# Patient Record
Sex: Female | Born: 1990 | Hispanic: Yes | Marital: Married | Smoking: Never smoker
Health system: Southern US, Community
[De-identification: ages and names within clinical notes are randomized; demographics above are authoritative.]

## PROBLEM LIST (undated history)

## (undated) DIAGNOSIS — A048 Other specified bacterial intestinal infections: Secondary | ICD-10-CM

---

## 2016-09-30 ENCOUNTER — Encounter: Payer: Self-pay | Admitting: *Deleted

## 2016-09-30 ENCOUNTER — Emergency Department
Admission: EM | Admit: 2016-09-30 | Discharge: 2016-09-30 | Disposition: A | Discharge status: Home / Self Care | Payer: Self-pay | Attending: Emergency Medicine | Admitting: Emergency Medicine

## 2016-09-30 DIAGNOSIS — R42 Dizziness and giddiness: Secondary | ICD-10-CM | POA: Insufficient documentation

## 2016-09-30 DIAGNOSIS — R51 Headache: Secondary | ICD-10-CM | POA: Insufficient documentation

## 2016-09-30 HISTORY — DX: Other specified bacterial intestinal infections: A04.8

## 2016-09-30 LAB — CBC
HCT: 40.6 % (ref 35.0–47.0)
Hemoglobin: 13.5 g/dL (ref 12.0–16.0)
MCH: 29.3 pg (ref 26.0–34.0)
MCHC: 33.3 g/dL (ref 32.0–36.0)
MCV: 88 fL (ref 80.0–100.0)
PLATELETS: 249 10*3/uL (ref 150–440)
RBC: 4.61 MIL/uL (ref 3.80–5.20)
RDW: 13.7 % (ref 11.5–14.5)
WBC: 8.6 10*3/uL (ref 3.6–11.0)

## 2016-09-30 LAB — URINALYSIS, COMPLETE (UACMP) WITH MICROSCOPIC
BILIRUBIN URINE: NEGATIVE
Glucose, UA: NEGATIVE mg/dL
Hgb urine dipstick: NEGATIVE
KETONES UR: NEGATIVE mg/dL
LEUKOCYTES UA: NEGATIVE
Nitrite: NEGATIVE
PH: 5 (ref 5.0–8.0)
PROTEIN: NEGATIVE mg/dL
Specific Gravity, Urine: 1.014 (ref 1.005–1.030)

## 2016-09-30 LAB — BASIC METABOLIC PANEL
Anion gap: 8 (ref 5–15)
BUN: 11 mg/dL (ref 6–20)
CALCIUM: 9.5 mg/dL (ref 8.9–10.3)
CO2: 27 mmol/L (ref 22–32)
CREATININE: 0.63 mg/dL (ref 0.44–1.00)
Chloride: 104 mmol/L (ref 101–111)
GFR calc non Af Amer: >60 mL/min (ref >60)
Glucose, Bld: 97 mg/dL (ref 65–99)
Potassium: 3.7 mmol/L (ref 3.5–5.1)
SODIUM: 139 mmol/L (ref 135–145)

## 2016-09-30 LAB — PREGNANCY, URINE: PREG TEST UR: NEGATIVE

## 2016-09-30 LAB — POCT PREGNANCY, URINE: PREG TEST UR: NEGATIVE

## 2016-09-30 MED ORDER — MECLIZINE HCL 25 MG PO TABS
25.0000 mg | ORAL_TABLET | Freq: Once | ORAL | Status: AC
  Administered 2016-09-30: 25 mg via ORAL
  Filled 2016-09-30 (×2): qty 1

## 2016-09-30 MED ORDER — MECLIZINE HCL 25 MG PO TABS: 25.0000 mg | ORAL_TABLET | Freq: Three times a day (TID) | ORAL | 0 refills | Status: AC | PRN

## 2016-09-30 NOTE — Discharge Instructions (Signed)
Please seek medical attention for any high fevers, chest pain, shortness of breath, change in behavior, persistent vomiting, bloody stool or any other new or concerning symptoms.  

## 2016-09-30 NOTE — ED Triage Notes (Signed)
Pt presents w/ c/o dizziness starting this morning when she woke. Pt states dizziness worse w/ position change. Pt has flown here from [place] in past 30 days to visit. Pt denies n/v/d. Pt states able to drink PO fluids. Pt denies relief from dizziness. Pt not using hormone therapy for birth control at this time. Pt has no chronic or acute illness at this time per report.

## 2016-09-30 NOTE — ED Notes (Signed)
Pt friend is interpreter ing for her and pt states that is ok - pt states that she has been dizzy since she woke up this morning - she flew in from [place] 20 days ago - denies nausea or vomiting - pt states that the dizziness is causing her to have a headache off and on - pt reports that when she is laying down she is not dizzy but with position changes she becomes dizzy

## 2016-09-30 NOTE — ED Provider Notes (Signed)
[facility] Emergency Department Provider Note  ____________________________________________   I have reviewed the triage vital signs and the nursing notes.   HISTORY  Chief Complaint Dizziness   History limited by: Language [facility] Interpreter utilized   HPI Destiny Franklin is a 26 y.o. female who presents to the emergency department today with primary concern for dizziness. The patient states it started this morning when she woke up. She noticed it when she went to stand up and get out of bed. She does describe it as the sensation of the room spinning around and she has a hard time fixating on a point. This goes away when the patient lays back down. It also happens of the patient rolls in bed. Patient has had a mild headache with this. She does have history of somewhat similar symptoms roughly 2 weeks ago. The patient went to urgent care where during exam the doctor noticed RLQ pain. The patient denies having pain there otherwise and only had pain when he was pressing hard.   Past Medical History:  Diagnosis Date  . H. pylori infection     There are no active problems to display for this patient.   History reviewed. No pertinent surgical history.  Prior to Admission medications   Not on File    Allergies Metoclopramide  History reviewed. No pertinent family history.  Social History Social History  Substance Use Topics  . Smoking status: Never Smoker  . Smokeless tobacco: Never Used  . Alcohol use No    Review of Systems  Constitutional: Negative for fever. Cardiovascular: Negative for chest pain. Respiratory: Negative for shortness of breath. Gastrointestinal: Negative for abdominal pain, vomiting and diarrhea. Genitourinary: Negative for dysuria. Musculoskeletal: Negative for back pain. Skin: Negative for rash. Neurological: Positive for dizziness.   10-point ROS otherwise  negative.  ____________________________________________   PHYSICAL EXAM:  VITAL SIGNS: ED Triage Vitals  Enc Vitals Group     BP 09/30/16 1950 127/83     Pulse Rate 09/30/16 1950 70     Resp 09/30/16 1950 20     Temp 09/30/16 1950 99.4 F (37.4 C)     Temp Source 09/30/16 1950 Oral     SpO2 09/30/16 1950 100 %     Weight 09/30/16 1950 150 lb (68 kg)     Height 09/30/16 1950 5\' 7"  (1.702 m)     Head Circumference --      Peak Flow --      Pain Score 09/30/16 1955 5   Constitutional: Alert and oriented. Well appearing and in no distress. Eyes: Conjunctivae are normal. Normal extraocular movements. ENT   Head: Normocephalic and atraumatic.   Nose: No congestion/rhinnorhea.   Mouth/Throat: Mucous membranes are moist.   Neck: No stridor. Hematological/Lymphatic/Immunilogical: No cervical lymphadenopathy. Cardiovascular: Normal rate, regular rhythm.  No murmurs, rubs, or gallops. Respiratory: Normal respiratory effort without tachypnea nor retractions. Breath sounds are clear and equal bilaterally. No wheezes/rales/rhonchi. Gastrointestinal: Soft and non tender. No rebound. No guarding.  Genitourinary: Deferred Musculoskeletal: Normal range of motion in all extremities. No lower extremity edema. Neurologic:  Normal speech and language. No gross focal neurologic deficits are appreciated.  Skin:  Skin is warm, dry and intact. No rash noted. Psychiatric: Mood and affect are normal. Speech and behavior are normal. Patient exhibits appropriate insight and judgment.  ____________________________________________    LABS (pertinent positives/negatives)  Labs Reviewed  URINALYSIS, COMPLETE (UACMP) WITH MICROSCOPIC - Abnormal; Notable for the following:  Result Value   Color, Urine YELLOW (*)    APPearance HAZY (*)    Bacteria, UA RARE (*)    Squamous Epithelial / LPF 6-30 (*)    All other components within normal limits  BASIC METABOLIC PANEL  CBC  PREGNANCY,  URINE  POCT PREGNANCY, URINE  CBG MONITORING, ED     ____________________________________________   EKG  I, Phineas SemenGraydon Wael Maestas, attending physician, personally viewed and interpreted this EKG  EKG Time: 2023 Rate: 89 Rhythm: normal sinus rhythm Axis: normal Intervals: qtc 435 QRS: narrow ST changes: no st elevation Impression: normal ekg   ____________________________________________    RADIOLOGY  None   ____________________________________________   PROCEDURES  Procedures  ____________________________________________   INITIAL IMPRESSION / ASSESSMENT AND PLAN / ED COURSE  Pertinent labs & imaging results that were available during my care of the patient were reviewed by me and considered in my medical decision making (see chart for details).  Patient presented to the emergency department today with concerns for vertigo type symptoms. Patient did feel better after Antivert. Blood work without any concerning anemia or electrolyte abnormalities. At this point will plan on discharging home with prescription for Antivert.  ____________________________________________   FINAL CLINICAL IMPRESSION(S) / ED DIAGNOSES  Final diagnoses:  Vertigo     Note: This dictation was prepared with Dragon dictation. Any transcriptional errors that result from this process are unintentional     Phineas SemenGraydon Derk Doubek, MD 09/30/16 2159

## 2016-09-30 NOTE — ED Notes (Signed)
ED Provider at bedside. 

## 2017-09-16 ENCOUNTER — Other Ambulatory Visit: Payer: Self-pay

## 2017-09-16 ENCOUNTER — Encounter: Payer: Self-pay | Admitting: Emergency Medicine

## 2017-09-16 DIAGNOSIS — N83202 Unspecified ovarian cyst, left side: Secondary | ICD-10-CM | POA: Insufficient documentation

## 2017-09-16 DIAGNOSIS — N390 Urinary tract infection, site not specified: Secondary | ICD-10-CM | POA: Insufficient documentation

## 2017-09-16 LAB — COMPREHENSIVE METABOLIC PANEL
ALT: 19 U/L (ref 14–54)
ANION GAP: 10 (ref 5–15)
AST: 25 U/L (ref 15–41)
Albumin: 4.6 g/dL (ref 3.5–5.0)
Alkaline Phosphatase: 45 U/L (ref 38–126)
BUN: 13 mg/dL (ref 6–20)
CALCIUM: 9.3 mg/dL (ref 8.9–10.3)
CHLORIDE: 105 mmol/L (ref 101–111)
CO2: 23 mmol/L (ref 22–32)
CREATININE: 0.69 mg/dL (ref 0.44–1.00)
GFR calc Af Amer: >60 mL/min (ref >60)
Glucose, Bld: 101 mg/dL — ABNORMAL HIGH (ref 65–99)
Potassium: 3.4 mmol/L — ABNORMAL LOW (ref 3.5–5.1)
SODIUM: 138 mmol/L (ref 135–145)
Total Bilirubin: 0.5 mg/dL (ref 0.3–1.2)
Total Protein: 7.6 g/dL (ref 6.5–8.1)

## 2017-09-16 LAB — CBC
HCT: 38.3 % (ref 35.0–47.0)
HEMOGLOBIN: 12.6 g/dL (ref 12.0–16.0)
MCH: 28.6 pg (ref 26.0–34.0)
MCHC: 33 g/dL (ref 32.0–36.0)
MCV: 86.7 fL (ref 80.0–100.0)
PLATELETS: 258 10*3/uL (ref 150–440)
RBC: 4.42 MIL/uL (ref 3.80–5.20)
RDW: 13.6 % (ref 11.5–14.5)
WBC: 9 10*3/uL (ref 3.6–11.0)

## 2017-09-16 LAB — URINALYSIS, COMPLETE (UACMP) WITH MICROSCOPIC
Bilirubin Urine: NEGATIVE
GLUCOSE, UA: NEGATIVE mg/dL
KETONES UR: NEGATIVE mg/dL
Nitrite: NEGATIVE
PROTEIN: NEGATIVE mg/dL
Specific Gravity, Urine: 1.016 (ref 1.005–1.030)
pH: 5 (ref 5.0–8.0)

## 2017-09-16 LAB — POCT PREGNANCY, URINE: PREG TEST UR: NEGATIVE

## 2017-09-16 LAB — LIPASE, BLOOD: LIPASE: 39 U/L (ref 11–51)

## 2017-09-16 NOTE — ED Triage Notes (Signed)
Patient to ER for c/o generalized abd pain, shortness of breath, fever, N/V/D since 09/03/17. Patient states her period was on 08/29/17, then developed these symptoms 5 days later. States when she lays down she is not nauseated, but feels nauseated upon waking. Patient in no acute distress at this time. Ambulatory to triage without difficulty.

## 2017-09-17 ENCOUNTER — Emergency Department: Payer: Self-pay

## 2017-09-17 ENCOUNTER — Emergency Department
Admission: EM | Admit: 2017-09-17 | Discharge: 2017-09-17 | Disposition: A | Discharge status: Home / Self Care | Payer: Self-pay | Attending: Emergency Medicine | Admitting: Emergency Medicine

## 2017-09-17 DIAGNOSIS — R52 Pain, unspecified: Secondary | ICD-10-CM

## 2017-09-17 DIAGNOSIS — N390 Urinary tract infection, site not specified: Secondary | ICD-10-CM

## 2017-09-17 DIAGNOSIS — N83202 Unspecified ovarian cyst, left side: Secondary | ICD-10-CM

## 2017-09-17 DIAGNOSIS — R102 Pelvic and perineal pain: Secondary | ICD-10-CM

## 2017-09-17 LAB — WET PREP, GENITAL
Clue Cells Wet Prep HPF POC: NONE SEEN
SPERM: NONE SEEN
Trich, Wet Prep: NONE SEEN
YEAST WET PREP: NONE SEEN

## 2017-09-17 LAB — HCG, QUANTITATIVE, PREGNANCY

## 2017-09-17 LAB — CHLAMYDIA/NGC RT PCR (ARMC ONLY)
CHLAMYDIA TR: NOT DETECTED
N gonorrhoeae: NOT DETECTED

## 2017-09-17 MED ORDER — FOSFOMYCIN TROMETHAMINE 3 G PO PACK
3.0000 g | PACK | Freq: Once | ORAL | Status: AC
  Administered 2017-09-17: 3 g via ORAL
  Filled 2017-09-17: qty 3

## 2017-09-17 NOTE — Discharge Instructions (Signed)
You may take Tylenol and/or Ibuprofen as needed for discomfort.  Return to the ER for worsening symptoms, persistent vomiting, fever, difficulty breathing or other concerns.

## 2017-09-17 NOTE — ED Provider Notes (Signed)
[facility] Emergency Department Provider Note   ____________________________________________   First MD Initiated Contact with Patient 09/17/17 0256     (approximate)  I have reviewed the triage vital signs and the nursing notes.   HISTORY  Chief Complaint Abdominal Pain    HPI Destiny Franklin is a 27 y.o. female who presents to the ED from home with a chief complaint of pelvic pain.  Patient reports having a shorter than usual.  From 2/23-2/25.  Complains of pelvic pain and nausea only when she lays flat.  Triage note indicates patient complained of shortness of breath, fever, N/V/D; patient denies these complaints to me.  Thinks she might be pregnant because her breasts are tender.  Last sexual intercourse 5 days ago.  Denies current vaginal bleeding or discharge.  Denies fever, chills, chest pain, shortness of breath, dysuria, diarrhea.  Denies recent travel or trauma.   Past Medical History:  Diagnosis Date  . H. pylori infection   History of ovarian cysts  There are no active problems to display for this patient.   History reviewed. No pertinent surgical history.  Prior to Admission medications   Medication Sig Start Date End Date Taking? Authorizing Provider  meclizine (ANTIVERT) 25 MG tablet Take 1 tablet (25 mg total) by mouth 3 (three) times daily as needed for dizziness. 09/30/16   Phineas Semen, MD    Allergies Metoclopramide  No family history on file.  Social History Social History   Tobacco Use  . Smoking status: Never Smoker  . Smokeless tobacco: Never Used  Substance Use Topics  . Alcohol use: No  . Drug use: No    Review of Systems  Constitutional: No fever/chills. Eyes: No visual changes. ENT: No sore throat. Cardiovascular: Denies chest pain. Respiratory: Denies shortness of breath. Gastrointestinal: Positive for pelvic pain.  No abdominal pain.  Positive for nausea, no vomiting.  No diarrhea.  No  constipation. Genitourinary: Negative for dysuria. Musculoskeletal: Negative for back pain. Skin: Negative for rash. Neurological: Negative for headaches, focal weakness or numbness.   ____________________________________________   PHYSICAL EXAM:  VITAL SIGNS: ED Triage Vitals [09/16/17 2311]  Enc Vitals Group     BP (!) 147/79     Pulse Rate 83     Resp 20     Temp 98.8 F (37.1 C)     Temp Source Oral     SpO2 100 %     Weight 160 lb (72.6 kg)     Height 5\' 5"  (1.651 m)     Head Circumference      Peak Flow      Pain Score 8     Pain Loc      Pain Edu?      Excl. in GC?     Constitutional: Alert and oriented. Well appearing and in no acute distress. Eyes: Conjunctivae are normal. PERRL. EOMI. Head: Atraumatic. Nose: No congestion/rhinnorhea. Mouth/Throat: Mucous membranes are moist.  Oropharynx non-erythematous. Neck: No stridor.   Cardiovascular: Normal rate, regular rhythm. Grossly normal heart sounds.  Good peripheral circulation. Respiratory: Normal respiratory effort.  No retractions. Lungs CTAB. Gastrointestinal: Soft and nontender to light or deep palpation. No distention. No abdominal bruits. No CVA tenderness. Musculoskeletal: No lower extremity tenderness nor edema.  No joint effusions. Neurologic:  Normal speech and language. No gross focal neurologic deficits are appreciated. No gait instability. Skin:  Skin is warm, dry and intact. No rash noted. Psychiatric: Mood and affect are normal. Speech and  behavior are normal.  ____________________________________________   LABS (all labs ordered are listed, but only abnormal results are displayed)  Labs Reviewed  WET PREP, GENITAL - Abnormal; Notable for the following components:      Result Value   WBC, Wet Prep HPF POC MANY (*)    All other components within normal limits  COMPREHENSIVE METABOLIC PANEL - Abnormal; Notable for the following components:   Potassium 3.4 (*)    Glucose, Bld 101 (*)     All other components within normal limits  URINALYSIS, COMPLETE (UACMP) WITH MICROSCOPIC - Abnormal; Notable for the following components:   Color, Urine YELLOW (*)    APPearance CLEAR (*)    Hgb urine dipstick SMALL (*)    Leukocytes, UA TRACE (*)    Bacteria, UA RARE (*)    Squamous Epithelial / LPF 0-5 (*)    All other components within normal limits  CHLAMYDIA/NGC RT PCR ([facility] ONLY)  LIPASE, BLOOD  CBC  HCG, QUANTITATIVE, PREGNANCY  POC URINE PREG, ED  POCT PREGNANCY, URINE   ____________________________________________  EKG  None ____________________________________________  RADIOLOGY  ED MD interpretation: Left corpus luteum cyst  Official radiology report(s): US Pelvis Transvanginal Non-ob (tv Only)  Result Date: 09/17/2017 CLINICAL DATA:  Nonspecific pelvic pain for 2 weeks EXAM: TRANSABDOMINAL AND TRANSVAGINAL ULTRASOUND OF PELVIS DOPPLER ULTRASOUND OF OVARIES TECHNIQUE: Both transabdominal and transvaginal ultrasound examinations of the pelvis were performed. Transabdominal technique was performed for global imaging of the pelvis including uterus, ovaries, adnexal regions, and pelvic cul-de-sac. It was necessary to proceed with endovaginal exam following the transabdominal exam to visualize the endometrium and ovaries. Color and duplex Doppler ultrasound was utilized to evaluate blood flow to the ovaries. COMPARISON:  None. FINDINGS: Uterus Measurements: 6.8 x 3.4 x 4.8 cm. No fibroids or other mass visualized. Endometrium Thickness: 8 mm.  No focal abnormality visualized. Right ovary Measurements: 3.9 x 2.1 x 2.0 cm. Normal appearance/no adnexal mass. Left ovary Measurements: 4.0 x 2.7 x 2.9 cm. Suspected corpus luteum cyst measuring 1.6 x 2.1 x 1.6 cm. Pulsed Doppler evaluation of both ovaries demonstrates normal low-resistance arterial and venous waveforms. Other findings Small amount of free fluid. IMPRESSION: 1. No acute abnormality of the pelvis. 2. Probable  left corpus luteum cyst. Electronically Signed   By: Deatra Robinson M.D.   On: 09/17/2017 04:30   US Pelvis Complete  Result Date: 09/17/2017 CLINICAL DATA:  Nonspecific pelvic pain for 2 weeks EXAM: TRANSABDOMINAL AND TRANSVAGINAL ULTRASOUND OF PELVIS DOPPLER ULTRASOUND OF OVARIES TECHNIQUE: Both transabdominal and transvaginal ultrasound examinations of the pelvis were performed. Transabdominal technique was performed for global imaging of the pelvis including uterus, ovaries, adnexal regions, and pelvic cul-de-sac. It was necessary to proceed with endovaginal exam following the transabdominal exam to visualize the endometrium and ovaries. Color and duplex Doppler ultrasound was utilized to evaluate blood flow to the ovaries. COMPARISON:  None. FINDINGS: Uterus Measurements: 6.8 x 3.4 x 4.8 cm. No fibroids or other mass visualized. Endometrium Thickness: 8 mm.  No focal abnormality visualized. Right ovary Measurements: 3.9 x 2.1 x 2.0 cm. Normal appearance/no adnexal mass. Left ovary Measurements: 4.0 x 2.7 x 2.9 cm. Suspected corpus luteum cyst measuring 1.6 x 2.1 x 1.6 cm. Pulsed Doppler evaluation of both ovaries demonstrates normal low-resistance arterial and venous waveforms. Other findings Small amount of free fluid. IMPRESSION: 1. No acute abnormality of the pelvis. 2. Probable left corpus luteum cyst. Electronically Signed   By: Deatra Robinson M.D.   On:  09/17/2017 04:30   [place] Pelvic Doppler (torsion R/o Or Mass Arterial Flow)  Result Date: 09/17/2017 CLINICAL DATA:  Nonspecific pelvic pain for 2 weeks EXAM: TRANSABDOMINAL AND TRANSVAGINAL ULTRASOUND OF PELVIS DOPPLER ULTRASOUND OF OVARIES TECHNIQUE: Both transabdominal and transvaginal ultrasound examinations of the pelvis were performed. Transabdominal technique was performed for global imaging of the pelvis including uterus, ovaries, adnexal regions, and pelvic cul-de-sac. It was necessary to proceed with endovaginal exam following the  transabdominal exam to visualize the endometrium and ovaries. Color and duplex Doppler ultrasound was utilized to evaluate blood flow to the ovaries. COMPARISON:  None. FINDINGS: Uterus Measurements: 6.8 x 3.4 x 4.8 cm. No fibroids or other mass visualized. Endometrium Thickness: 8 mm.  No focal abnormality visualized. Right ovary Measurements: 3.9 x 2.1 x 2.0 cm. Normal appearance/no adnexal mass. Left ovary Measurements: 4.0 x 2.7 x 2.9 cm. Suspected corpus luteum cyst measuring 1.6 x 2.1 x 1.6 cm. Pulsed Doppler evaluation of both ovaries demonstrates normal low-resistance arterial and venous waveforms. Other findings Small amount of free fluid. IMPRESSION: 1. No acute abnormality of the pelvis. 2. Probable left corpus luteum cyst. Electronically Signed   By: Deatra RobinsonKevin  Herman M.D.   On: 09/17/2017 04:30    ____________________________________________   PROCEDURES  Procedure(s) performed:   Pelvic exam: External exam WNL without rashes, lesions or vesicles. Speculum exam reveals no active bleeding. White discharge noted. Bimanual exam WNL.  Procedures  Critical Care performed: No  ____________________________________________   INITIAL IMPRESSION / ASSESSMENT AND PLAN / ED COURSE  As part of my medical decision making, I reviewed the following data within the electronic MEDICAL RECORD NUMBER Nursing notes reviewed and incorporated, Labs reviewed, Old chart reviewed, Radiograph reviewed  and Notes from prior ED visits   27 year old female who presents with shorter than usual last menstrual cycle, pelvic pain, tender breasts and nausea only when she lays down. Differential diagnosis includes, but is not limited to, ovarian cyst, ovarian torsion, acute appendicitis, diverticulitis, urinary tract infection/pyelonephritis, endometriosis, bowel obstruction, colitis, renal colic, gastroenteritis, hernia, fibroids, endometriosis, pregnancy related pain including ectopic pregnancy, etc.  Laboratory and  urinalysis results remarkable for mild UTI.  Will obtain beta-hCG, perform pelvic exam with swabs and proceed with pelvic ultrasound.  Clinical Course as of Sep 18 711  Thu Sep 17, 2017  0604 Updated patient of ultrasound results.  She desires to wait for chlamydia/gonorrhea results to return.  [JS]  [id number] Updated patient of negative STD result.  Will administer single dose of fosfomycin prior to discharge for mild UTI.  Strict return precautions given.  Patient verbalizes understanding and agrees with plan of care.  [JS]    Clinical Course User Index [JS] Irean HongSung, Montrez Marietta J, MD     ____________________________________________   FINAL CLINICAL IMPRESSION(S) / ED DIAGNOSES  Final diagnoses:  Pain  Pelvic pain in female  Cyst of left ovary  Lower urinary tract infectious disease     ED Discharge Orders    None       Note:  This document was prepared using Dragon voice recognition software and may include unintentional dictation errors.    Irean HongSung, Maresha Anastos J, MD 09/17/17 [phone] [id number]

## 2017-11-21 ENCOUNTER — Other Ambulatory Visit: Payer: Self-pay

## 2017-11-21 ENCOUNTER — Emergency Department (HOSPITAL_COMMUNITY)
Admission: EM | Admit: 2017-11-21 | Discharge: 2017-11-21 | Disposition: A | Discharge status: Home / Self Care | Payer: Self-pay | Attending: Emergency Medicine | Admitting: Emergency Medicine

## 2017-11-21 ENCOUNTER — Encounter (HOSPITAL_COMMUNITY): Payer: Self-pay

## 2017-11-21 DIAGNOSIS — Z5321 Procedure and treatment not carried out due to patient leaving prior to being seen by health care provider: Secondary | ICD-10-CM | POA: Insufficient documentation

## 2017-11-21 DIAGNOSIS — R1110 Vomiting, unspecified: Secondary | ICD-10-CM | POA: Insufficient documentation

## 2017-11-21 DIAGNOSIS — Z3201 Encounter for pregnancy test, result positive: Secondary | ICD-10-CM | POA: Insufficient documentation

## 2017-11-21 LAB — I-STAT BETA HCG BLOOD, ED (MC, WL, AP ONLY): I-stat hCG, quantitative: 450.8 m[IU]/mL — ABNORMAL HIGH (ref <5)

## 2017-11-21 NOTE — ED Triage Notes (Signed)
She has had a few episodes of emesis. She is concerned she may be pregnant. She denies fever/dysuria nor any other sign of illness.

## 2017-11-21 NOTE — ED Notes (Signed)
Pt requesting to leave but wanted to know pregnancy test results.  Spoke to MD Dr. Jeraldine Loots.  MD gave permission to tell patient she is pregnant.  Pt then admits to having a positive pregnancy test but wanted ER to confirm.  Gave information regarding community health and wellness and told to follow up with [facility] physician.  Pt left without being seen.

## 2017-12-21 ENCOUNTER — Other Ambulatory Visit: Payer: Self-pay | Admitting: Advanced Practice Midwife

## 2017-12-21 DIAGNOSIS — Z369 Encounter for antenatal screening, unspecified: Secondary | ICD-10-CM

## 2018-01-04 ENCOUNTER — Encounter: Payer: Self-pay | Admitting: Emergency Medicine

## 2018-01-04 ENCOUNTER — Other Ambulatory Visit: Payer: Self-pay

## 2018-01-04 ENCOUNTER — Emergency Department: Payer: Self-pay

## 2018-01-04 ENCOUNTER — Emergency Department
Admission: EM | Admit: 2018-01-04 | Discharge: 2018-01-04 | Disposition: A | Discharge status: Home / Self Care | Payer: Self-pay | Attending: Emergency Medicine | Admitting: Emergency Medicine

## 2018-01-04 DIAGNOSIS — O9989 Other specified diseases and conditions complicating pregnancy, childbirth and the puerperium: Secondary | ICD-10-CM | POA: Insufficient documentation

## 2018-01-04 DIAGNOSIS — O23591 Infection of other part of genital tract in pregnancy, first trimester: Secondary | ICD-10-CM | POA: Insufficient documentation

## 2018-01-04 DIAGNOSIS — Z3A10 10 weeks gestation of pregnancy: Secondary | ICD-10-CM | POA: Insufficient documentation

## 2018-01-04 DIAGNOSIS — R1031 Right lower quadrant pain: Secondary | ICD-10-CM | POA: Insufficient documentation

## 2018-01-04 DIAGNOSIS — B9689 Other specified bacterial agents as the cause of diseases classified elsewhere: Secondary | ICD-10-CM

## 2018-01-04 DIAGNOSIS — N760 Acute vaginitis: Secondary | ICD-10-CM

## 2018-01-04 DIAGNOSIS — R109 Unspecified abdominal pain: Secondary | ICD-10-CM

## 2018-01-04 DIAGNOSIS — O26891 Other specified pregnancy related conditions, first trimester: Secondary | ICD-10-CM

## 2018-01-04 LAB — LIPASE, BLOOD: Lipase: 40 U/L (ref 11–51)

## 2018-01-04 LAB — WET PREP, GENITAL
Clue Cells Wet Prep HPF POC: NONE SEEN
SPERM: NONE SEEN
Trich, Wet Prep: NONE SEEN
YEAST WET PREP: NONE SEEN

## 2018-01-04 LAB — COMPREHENSIVE METABOLIC PANEL
ALK PHOS: 34 U/L — AB (ref 38–126)
ALT: 12 U/L (ref 0–44)
ANION GAP: 7 (ref 5–15)
AST: 17 U/L (ref 15–41)
Albumin: 3.9 g/dL (ref 3.5–5.0)
BILIRUBIN TOTAL: 0.8 mg/dL (ref 0.3–1.2)
BUN: 9 mg/dL (ref 6–20)
CALCIUM: 9.1 mg/dL (ref 8.9–10.3)
CO2: 23 mmol/L (ref 22–32)
Chloride: 107 mmol/L (ref 98–111)
Creatinine, Ser: 0.42 mg/dL — ABNORMAL LOW (ref 0.44–1.00)
GLUCOSE: 95 mg/dL (ref 70–99)
Potassium: 3.7 mmol/L (ref 3.5–5.1)
Sodium: 137 mmol/L (ref 135–145)
TOTAL PROTEIN: 7 g/dL (ref 6.5–8.1)

## 2018-01-04 LAB — URINALYSIS, COMPLETE (UACMP) WITH MICROSCOPIC
BILIRUBIN URINE: NEGATIVE
GLUCOSE, UA: NEGATIVE mg/dL
Hgb urine dipstick: NEGATIVE
KETONES UR: NEGATIVE mg/dL
LEUKOCYTES UA: NEGATIVE
NITRITE: NEGATIVE
PH: 5 (ref 5.0–8.0)
PROTEIN: NEGATIVE mg/dL
Specific Gravity, Urine: 1.003 — ABNORMAL LOW (ref 1.005–1.030)
WBC, UA: NONE SEEN WBC/hpf (ref 0–5)

## 2018-01-04 LAB — CBC
HEMATOCRIT: 32.2 % — AB (ref 35.0–47.0)
HEMOGLOBIN: 11.5 g/dL — AB (ref 12.0–16.0)
MCH: 30.6 pg (ref 26.0–34.0)
MCHC: 35.7 g/dL (ref 32.0–36.0)
MCV: 85.6 fL (ref 80.0–100.0)
Platelets: 235 10*3/uL (ref 150–440)
RBC: 3.76 MIL/uL — ABNORMAL LOW (ref 3.80–5.20)
RDW: 13.1 % (ref 11.5–14.5)
WBC: 8.6 10*3/uL (ref 3.6–11.0)

## 2018-01-04 LAB — CHLAMYDIA/NGC RT PCR (ARMC ONLY)
Chlamydia Tr: NOT DETECTED
N GONORRHOEAE: NOT DETECTED

## 2018-01-04 LAB — HCG, QUANTITATIVE, PREGNANCY: hCG, Beta Chain, Quant, S: 87512 m[IU]/mL — ABNORMAL HIGH (ref <5)

## 2018-01-04 MED ORDER — METRONIDAZOLE 500 MG PO TABS
500.0000 mg | ORAL_TABLET | Freq: Once | ORAL | Status: AC
  Administered 2018-01-04: 500 mg via ORAL
  Filled 2018-01-04: qty 1

## 2018-01-04 MED ORDER — ACETAMINOPHEN 500 MG PO TABS
1000.0000 mg | ORAL_TABLET | Freq: Once | ORAL | Status: AC
  Administered 2018-01-04: 1000 mg via ORAL
  Filled 2018-01-04: qty 2

## 2018-01-04 MED ORDER — METRONIDAZOLE 500 MG PO TABS: 500.0000 mg | ORAL_TABLET | Freq: Two times a day (BID) | ORAL | 0 refills | Status: AC

## 2018-01-04 NOTE — ED Provider Notes (Signed)
[facility] Emergency Department Provider Note  ____________________________________________  Time seen: Approximately 6:39 PM  I have reviewed the triage vital signs and the nursing notes.   HISTORY  Chief Complaint Abdominal Pain and Emesis   HPI Destiny Franklin is a 27 y.o. female with h/o ovarian cyst, hepatitis A, and H. Pylori who presents for evaluation of abdominal pain.  Patient reports dull constant pain located in her lower abdomen and lower back since yesterday.  She reports that the pain feels like a pressure and goes around as a band in her abdomen.  She believes that she is [redacted] weeks pregnant per LMP.  Has not established care or had an ultrasound for this pregnancy yet.  She has had nausea and vomiting but that has been an ongoing issue during the pregnancy.  She denies dysuria, hematuria, fever, chills, constipation or diarrhea.  She reports that the pain is worse on the right side but present bilaterally.  She also reports 2 days of a heavy white discharge.  She denies any prior history of STDs.  She denies any prior abdominal surgeries.  Past Medical History:  Diagnosis Date  . H. pylori infection     There are no active problems to display for this patient.   History reviewed. No pertinent surgical history.  Prior to Admission medications   Medication Sig Start Date End Date Taking? Authorizing Provider  meclizine (ANTIVERT) 25 MG tablet Take 1 tablet (25 mg total) by mouth 3 (three) times daily as needed for dizziness. 09/30/16   Phineas Semen, MD  metroNIDAZOLE (FLAGYL) 500 MG tablet Take 1 tablet (500 mg total) by mouth 2 (two) times daily for 7 days. 01/04/18 01/11/18  Nita Sickle, MD    Allergies Metoclopramide  No family history on file.  Social History Social History   Tobacco Use  . Smoking status: Never Smoker  . Smokeless tobacco: Never Used  Substance Use Topics  . Alcohol use: No  . Drug use: No     Review of Systems  Constitutional: Negative for fever. Eyes: Negative for visual changes. ENT: Negative for sore throat. Neck: No neck pain  Cardiovascular: Negative for chest pain. Respiratory: Negative for shortness of breath. Gastrointestinal: + abdominal pain, nausea, and vomiting. No diarrhea. Genitourinary: Negative for dysuria. + vaginal discharge Musculoskeletal: Negative for back pain. Skin: Negative for rash. Neurological: Negative for headaches, weakness or numbness. Psych: No SI or HI  ____________________________________________   PHYSICAL EXAM:  VITAL SIGNS: ED Triage Vitals  Enc Vitals Group     BP 01/04/18 1726 121/72     Pulse Rate 01/04/18 1726 75     Resp 01/04/18 1726 20     Temp 01/04/18 1726 98.5 F (36.9 C)     Temp Source 01/04/18 1726 Oral     SpO2 01/04/18 1726 99 %     Weight 01/04/18 1727 154 lb 5.2 oz (70 kg)     Height 01/04/18 1727 5\' 8"  (1.727 m)     Head Circumference --      Peak Flow --      Pain Score 01/04/18 1727 9     Pain Loc --      Pain Edu? --      Excl. in GC? --     Constitutional: Alert and oriented. Well appearing and in no apparent distress. HEENT:      Head: Normocephalic and atraumatic.         Eyes: Conjunctivae are normal. Sclera  is non-icteric.       Mouth/Throat: Mucous membranes are moist.       Neck: Supple with no signs of meningismus. Cardiovascular: Regular rate and rhythm. No murmurs, gallops, or rubs. 2+ symmetrical distal pulses are present in all extremities. No JVD. Respiratory: Normal respiratory effort. Lungs are clear to auscultation bilaterally. No wheezes, crackles, or rhonchi.  Gastrointestinal: Soft, non tender, and non distended with positive bowel sounds. No rebound or guarding. Genitourinary: No CVA tenderness. Pelvic exam: Normal external genitalia, no rashes or lesions. Thick white discharge. Os closed. No cervical motion tenderness.  Tenderness to palpation over the R adnexa. No  uterine or left adnexal tenderness.   Musculoskeletal: Nontender with normal range of motion in all extremities. No edema, cyanosis, or erythema of extremities. Neurologic: Normal speech and language. Face is symmetric. Moving all extremities. No gross focal neurologic deficits are appreciated. Skin: Skin is warm, dry and intact. No rash noted. Psychiatric: Mood and affect are normal. Speech and behavior are normal.  ____________________________________________   LABS (all labs ordered are listed, but only abnormal results are displayed)  Labs Reviewed  WET PREP, GENITAL - Abnormal; Notable for the following components:      Result Value   WBC, Wet Prep HPF POC FEW (*)    All other components within normal limits  COMPREHENSIVE METABOLIC PANEL - Abnormal; Notable for the following components:   Creatinine, Ser 0.42 (*)    Alkaline Phosphatase 34 (*)    All other components within normal limits  CBC - Abnormal; Notable for the following components:   RBC 3.76 (*)    Hemoglobin 11.5 (*)    HCT 32.2 (*)    All other components within normal limits  URINALYSIS, COMPLETE (UACMP) WITH MICROSCOPIC - Abnormal; Notable for the following components:   Color, Urine STRAW (*)    APPearance CLEAR (*)    Specific Gravity, Urine 1.003 (*)    Bacteria, UA MANY (*)    All other components within normal limits  HCG, QUANTITATIVE, PREGNANCY - Abnormal; Notable for the following components:   hCG, Beta Chain, Quant, S 95,284 (*)    All other components within normal limits  CHLAMYDIA/NGC RT PCR ([facility] ONLY)  URINE CULTURE  LIPASE, BLOOD   ____________________________________________  EKG  none  ____________________________________________  RADIOLOGY  I have personally reviewed the images performed during this visit and I agree with the Radiologist's read.   Interpretation by Radiologist:  Mr Pelvis Wo Contrast  Result Date: 01/04/2018 CLINICAL DATA:  27 year old pregnant  female presenting with abdominal pain. Concern for acute appendicitis. EXAM: MRI ABDOMEN AND PELVIS WITHOUT CONTRAST TECHNIQUE: Multiplanar multisequence MR imaging of the abdomen and pelvis was performed. No intravenous contrast was administered. COMPARISON:  Pelvic ultrasound dated 01/04/2018 FINDINGS: COMBINED FINDINGS FOR BOTH MR ABDOMEN AND PELVIS Evaluation of this exam is limited due to respiratory motion artifact. Lower chest: The visualized lung bases are clear. Hepatobiliary: No mass or other parenchymal abnormality identified. Pancreas: No mass, inflammatory changes, or other parenchymal abnormality identified. Spleen:  Within normal limits in size and appearance. Adrenals/Urinary Tract: The adrenal glands are unremarkable. There is minimal fullness of the right renal collecting system. The left kidney is unremarkable. The visualized ureters and urinary bladder appear unremarkable. Stomach/Bowel: There is no bowel obstruction or active inflammation. The visualized appendix is unremarkable (series 11 images 84-93). Vascular/Lymphatic: No pathologically enlarged lymph nodes identified. No abdominal aortic aneurysm demonstrated. Reproductive: An intrauterine pregnancy is identified but not evaluated on this  MRI. The uterus and ovaries are unremarkable. Other:  None Musculoskeletal: No suspicious bone lesions identified. IMPRESSION: 1. No acute intra-abdominal or pelvic pathology. No bowel obstruction for active inflammation. Normal appendix. 2. Minimal fullness of the right renal collecting system. 3. Intrauterine pregnancy. Electronically Signed   By: Elgie Collard M.D.   On: 01/04/2018 22:50   Mr Abdomen Wo Contrast  Result Date: 01/04/2018 CLINICAL DATA:  27 year old pregnant female presenting with abdominal pain. Concern for acute appendicitis. EXAM: MRI ABDOMEN AND PELVIS WITHOUT CONTRAST TECHNIQUE: Multiplanar multisequence MR imaging of the abdomen and pelvis was performed. No intravenous  contrast was administered. COMPARISON:  Pelvic ultrasound dated 01/04/2018 FINDINGS: COMBINED FINDINGS FOR BOTH MR ABDOMEN AND PELVIS Evaluation of this exam is limited due to respiratory motion artifact. Lower chest: The visualized lung bases are clear. Hepatobiliary: No mass or other parenchymal abnormality identified. Pancreas: No mass, inflammatory changes, or other parenchymal abnormality identified. Spleen:  Within normal limits in size and appearance. Adrenals/Urinary Tract: The adrenal glands are unremarkable. There is minimal fullness of the right renal collecting system. The left kidney is unremarkable. The visualized ureters and urinary bladder appear unremarkable. Stomach/Bowel: There is no bowel obstruction or active inflammation. The visualized appendix is unremarkable (series 11 images 84-93). Vascular/Lymphatic: No pathologically enlarged lymph nodes identified. No abdominal aortic aneurysm demonstrated. Reproductive: An intrauterine pregnancy is identified but not evaluated on this MRI. The uterus and ovaries are unremarkable. Other:  None Musculoskeletal: No suspicious bone lesions identified. IMPRESSION: 1. No acute intra-abdominal or pelvic pathology. No bowel obstruction for active inflammation. Normal appendix. 2. Minimal fullness of the right renal collecting system. 3. Intrauterine pregnancy. Electronically Signed   By: Elgie Collard M.D.   On: 01/04/2018 22:50   US Ob Comp Less 14 Wks  Result Date: 01/04/2018 CLINICAL DATA:  Right lower quadrant pain EXAM: OBSTETRIC <14 WK [place] US DOPPLER ULTRASOUND OF OVARIES TECHNIQUE: Both transabdominal and transvaginal ultrasound examinations were performed for complete evaluation of the gestation as well as the maternal uterus, adnexal regions, and pelvic cul-de-sac. Transvaginal technique was performed to assess early pregnancy. Color and duplex Doppler ultrasound was utilized to evaluate blood flow to the ovaries. COMPARISON:  None. FINDINGS:  Intrauterine gestational sac: Single intrauterine gestation Yolk sac:  Not seen Embryo:  Visible Cardiac Activity: Visible Heart Rate: 171 bpm CRL: 41.2 mm   11 w 0 d                  [place] EDC: 07/26/2018 Subchorionic hemorrhage:  None visualized. Maternal uterus/adnexae: Ovaries are within normal limits. Left ovary measures 2.1 by 2.9 x 1.5 cm. The right ovary measures 1.8 by 2.6 x 1.4 cm. No significant free fluid. Pulsed Doppler evaluation of both ovaries demonstrates normal appearing low-resistance arterial and venous waveforms. IMPRESSION: 1. No sonographic evidence for ovarian torsion 2. Single viable intrauterine pregnancy as above. Electronically Signed   By: Jasmine Pang M.D.   On: 01/04/2018 20:04   US Pelvic Doppler (torsion R/o Or Mass Arterial Flow)  Result Date: 01/04/2018 CLINICAL DATA:  Right lower quadrant pain EXAM: OBSTETRIC <14 WK [place] US DOPPLER ULTRASOUND OF OVARIES TECHNIQUE: Both transabdominal and transvaginal ultrasound examinations were performed for complete evaluation of the gestation as well as the maternal uterus, adnexal regions, and pelvic cul-de-sac. Transvaginal technique was performed to assess early pregnancy. Color and duplex Doppler ultrasound was utilized to evaluate blood flow to the ovaries. COMPARISON:  None. FINDINGS: Intrauterine gestational sac: Single intrauterine gestation Yolk sac:  Not seen Embryo:  Visible Cardiac Activity: Visible Heart Rate: 171 bpm CRL: 41.2 mm   11 w 0 d                  [place] EDC: 07/26/2018 Subchorionic hemorrhage:  None visualized. Maternal uterus/adnexae: Ovaries are within normal limits. Left ovary measures 2.1 by 2.9 x 1.5 cm. The right ovary measures 1.8 by 2.6 x 1.4 cm. No significant free fluid. Pulsed Doppler evaluation of both ovaries demonstrates normal appearing low-resistance arterial and venous waveforms. IMPRESSION: 1. No sonographic evidence for ovarian torsion 2. Single viable intrauterine pregnancy as above. Electronically Signed    By: Jasmine Pang M.D.   On: 01/04/2018 20:04   US Appendix (abdomen Limited)  Result Date: 01/04/2018 CLINICAL DATA:  Right lower quadrant pain, [redacted] weeks pregnant EXAM: ULTRASOUND ABDOMEN LIMITED TECHNIQUE: Wallace Cullens scale imaging of the right lower quadrant was performed to evaluate for suspected appendicitis. Standard imaging planes and graded compression technique were utilized. COMPARISON:  None. FINDINGS: The appendix is not visualized. Ancillary findings: None. Factors affecting image quality: None. IMPRESSION: Nonvisualized appendix. Note: Non-visualization of appendix by [place] does not definitely exclude appendicitis. If there is sufficient clinical concern, consider abdomen pelvis MRI for further evaluation. Electronically Signed   By: Jasmine Pang M.D.   On: 01/04/2018 20:01      ____________________________________________   PROCEDURES  Procedure(s) performed: None Procedures Critical Care performed:  None ____________________________________________   INITIAL IMPRESSION / ASSESSMENT AND PLAN / ED COURSE  27 y.o. female with h/o ovarian cyst, hepatitis A, and H. Pylori currently [redacted] weeks pregnant per LMP who presents for evaluation of abdominal pain x 2 days.  Patient is well-appearing, no distress, she has normal vital signs, pelvic exam shows tenderness to palpation over the right ovary and a thick white discharge.  Patient is diffusely tender on the lower quadrants with no rebound or guarding.  Differential diagnosis includes ovarian pathology such as cyst or torsion, PID, tubo-ovarian abscess, ectopic pregnancy, UTI, appendicitis. Wet prep, Gc chlamydia pending, TVUS pending. Labs and UA negative for acute findings.   Clinical Course as of Jan 05 2308  Mon Jan 04, 2018  2308 MRI negative for any acute findings.  Vaginal swabs positive for BV.  Patient was started on Flagyl is going to be discharged home with follow-up with her OB/GYN.  Discussed return precautions.   [CV]     Clinical Course User Index [CV] Don Perking [place], MD     As part of my medical decision making, I reviewed the following data within the electronic MEDICAL RECORD NUMBER Nursing notes reviewed and incorporated, Labs reviewed , Old chart reviewed, Radiograph reviewed , Notes from prior ED visits and [place] Controlled Substance Database    Pertinent labs & imaging results that were available during my care of the patient were reviewed by me and considered in my medical decision making (see chart for details).    ____________________________________________   FINAL CLINICAL IMPRESSION(S) / ED DIAGNOSES  Final diagnoses:  RLQ abdominal pain  Abdominal pain during pregnancy in first trimester  BV (bacterial vaginosis)      NEW MEDICATIONS STARTED DURING THIS VISIT:  ED Discharge Orders        Ordered    metroNIDAZOLE (FLAGYL) 500 MG tablet  2 times daily     01/04/18 2245       Note:  This document was prepared using Dragon voice recognition software and may include unintentional dictation errors.    Don Perking,  [place], MD 01/04/18 [phone]

## 2018-01-04 NOTE — ED Notes (Signed)
Patient transported to MRI 

## 2018-01-04 NOTE — ED Triage Notes (Signed)
Nausea, vomiting, back pain and abd pain since yesterday. States about 10 weeks. Denies dysuria. Denies vaginal bleeding.

## 2018-01-04 NOTE — ED Notes (Signed)
Patient transported to Ultrasound 

## 2018-01-06 LAB — URINE CULTURE: Culture: 20000 — AB

## 2018-01-18 ENCOUNTER — Encounter: Payer: Self-pay | Admitting: *Deleted

## 2018-01-18 ENCOUNTER — Ambulatory Visit (HOSPITAL_BASED_OUTPATIENT_CLINIC_OR_DEPARTMENT_OTHER)
Admission: RE | Admit: 2018-01-18 | Discharge: 2018-01-18 | Disposition: A | Discharge status: Home / Self Care | Payer: Self-pay | Source: Ambulatory Visit | Attending: Obstetrics & Gynecology | Admitting: Obstetrics & Gynecology

## 2018-01-18 ENCOUNTER — Ambulatory Visit
Admission: RE | Admit: 2018-01-18 | Discharge: 2018-01-18 | Disposition: A | Discharge status: Home / Self Care | Payer: Self-pay | Source: Ambulatory Visit | Attending: Obstetrics & Gynecology | Admitting: Obstetrics & Gynecology

## 2018-01-18 VITALS — BP 107/74 | HR 102 | Temp 98.5°F | Resp 17 | Ht 68.0 in | Wt 150.2 lb

## 2018-01-18 DIAGNOSIS — Z8269 Family history of other diseases of the musculoskeletal system and connective tissue: Secondary | ICD-10-CM

## 2018-01-18 DIAGNOSIS — Z3A12 12 weeks gestation of pregnancy: Secondary | ICD-10-CM | POA: Insufficient documentation

## 2018-01-18 DIAGNOSIS — Z369 Encounter for antenatal screening, unspecified: Secondary | ICD-10-CM

## 2018-01-18 NOTE — Progress Notes (Addendum)
Referring physician:  [facility] Department Length of Consultation: 40 minutes   Ms. Destiny Franklin  was referred to [facility] for genetic counseling to review prenatal screening and testing options as well as the family history.  This note summarizes the information we discussed.    We offered the following routine screening tests for this pregnancy:  First trimester screening, which includes nuchal translucency ultrasound screen and first trimester maternal serum marker screening.  The nuchal translucency has approximately an 80% detection rate for Down syndrome and can be positive for other chromosome abnormalities as well as congenital heart defects.  When combined with a maternal serum marker screening, the detection rate is up to 90% for Down syndrome and up to 97% for trisomy 18.     Maternal serum marker screening, a blood test that measures pregnancy proteins, can provide risk assessments for Down syndrome, trisomy 18, and open neural tube defects (spina bifida, anencephaly). Because it does not directly examine the fetus, it cannot positively diagnose or rule out these problems.  Targeted ultrasound uses high frequency sound waves to create an image of the developing fetus.  An ultrasound is often recommended as a routine means of evaluating the pregnancy.  It is also used to screen for fetal anatomy problems (for example, a heart defect) that might be suggestive of a chromosomal or other abnormality.   Should these screening tests indicate an increased concern, then the following additional testing options would be offered:  The chorionic villus sampling procedure is available for first trimester chromosome analysis.  This involves the withdrawal of a small amount of chorionic villi (tissue from the developing placenta).  Risk of pregnancy loss is estimated to be approximately 1 in 200 to 1 in 100 (0.5 to 1%).  There is approximately a 1% (1 in  100) chance that the CVS chromosome results will be unclear.  Chorionic villi cannot be tested for neural tube defects.     Amniocentesis involves the removal of a small amount of amniotic fluid from the sac surrounding the fetus with the use of a thin needle inserted through the maternal abdomen and uterus.  Ultrasound guidance is used throughout the procedure.  Fetal cells from amniotic fluid are directly evaluated and > 99.5% of chromosome problems and > 98% of open neural tube defects can be detected. This procedure is generally performed after the 15th week of pregnancy.  The main risks to this procedure include complications leading to miscarriage in less than 1 in 200 cases (0.5%).  As another option for information if the pregnancy is suspected to be an an increased chance for certain chromosome conditions, we also reviewed the availability of cell free fetal DNA testing from maternal blood to determine whether or not the baby may have either Down syndrome, trisomy 74, or trisomy 72.  This test utilizes a maternal blood sample and DNA sequencing technology to isolate circulating cell free fetal DNA from maternal plasma.  The fetal DNA can then be analyzed for DNA sequences that are derived from the three most common chromosomes involved in aneuploidy, chromosomes 13, 18, and 21.  If the overall amount of DNA is greater than the expected level for any of these chromosomes, aneuploidy is suspected.  While we do not consider it a replacement for invasive testing and karyotype analysis, a negative result from this testing would be reassuring, though not a guarantee of a normal chromosome complement for the baby.  An abnormal result  is certainly suggestive of an abnormal chromosome complement, though we would still recommend CVS or amniocentesis to confirm any findings from this testing.  Cystic Fibrosis and Spinal Muscular Atrophy (SMA) screening were also discussed with the patient. Both conditions are  recessive, which means that both parents must be carriers in order to have a child with the disease.  Cystic fibrosis (CF) is one of the most common genetic conditions in persons of Caucasian ancestry.  This condition occurs in approximately 1 in 2,500 Caucasian persons and results in thickened secretions in the lungs, digestive, and reproductive systems.  For a baby to be at risk for having CF, both of the parents must be carriers for this condition.  Approximately 1 in 110 Caucasian persons is a carrier for CF.  Current carrier testing looks for the most common mutations in the gene for CF and can detect approximately 90% of carriers in the Caucasian population.  This means that the carrier screening can greatly reduce, but cannot eliminate, the chance for an individual to have a child with CF.  If an individual is found to be a carrier for CF, then carrier testing would be available for the partner. As part of Destiny Franklin's newborn screening profile, all babies born in the state of [place] will have a two-tier screening process.  Specimens are first tested to determine the concentration of immunoreactive trypsinogen (IRT).  The top 5% of specimens with the highest IRT values then undergo DNA testing using a panel of over 40 common CF mutations. SMA is a neurodegenerative disorder that leads to atrophy of skeletal muscle and overall weakness.  This condition is also more prevalent in the Caucasian population, with 1 in 40-1 in 60 persons being a carrier and 1 in 6,000-1 in 10,000 children being affected.  There are multiple forms of the disease, with some causing death in infancy to other forms with survival into adulthood.  The genetics of SMA is complex, but carrier screening can detect up to 95% of carriers in the Caucasian population.  Similar to CF, a negative result can greatly reduce, but cannot eliminate, the chance to have a child with SMA.  We obtained a detailed family history and pregnancy  history.  The patient reported several paternal relatives including her father and 3 of his brothers with glaucoma in adulthood.  There may be many factors involved in the development of glaucoma including dominant, recessive and multifactorial genetic contributions.  Most cases of adult onset glaucoma are thought to be due to a combination of genetic and environmental factors.  Ms. Destiny Franklin is likely at increased risk, though genetic testing is not commonly offered unless a specific genetic cause is known in the family.  She is followed by an ophthalmologist who is aware of this history.  The patient also reported one double first cousin (her mother's sister married her father's brother and they had a daughter) who passed away at age 26 years with a diagnosis of ALS (amyotrophic lateral sclerosis), or Destiny Franklin disease.  The patient was confident that this was the diagnosis in this cousin, who was raised in [place].  Childhood onset and death from ALS would be highly unusual, but if this were the case, and she was the only affected relative, then we would expect a low chance for recurrence for this pregnancy.  It is estimated that 90% of cases of ALS occur as an isolated case in a family.  Most familial cases are thought to  be dominant, with a 50% recurrence risk in the offspring of an affected individual.  If this cousin actually had another type of degenerative muscle condition, we cannot comment on the recurrence chance without additional medical information.  Lastly, the patient reported one paternal cousin, one maternal uncle and one maternal cousin with bipolar disorder.  We discussed that the genetic factors involved in most mental health conditions are not well understood, though it is thought that these conditions are likely due to a combination of inherited as well as situational or environmental components. The remainder of the family history was reported to be unremarkable for birth defects,  intellectual delays, recurrent pregnancy loss or known chromosome abnormalities.  Ms. Destiny Franklin stated that this is the first pregnancy for she and her partner.  She reported no complications or exposures that would be expected to increase the risk for birth defects.  After consideration of the options, Ms. Destiny Franklin elected to proceed with first trimester screening and to declined CF and SMA carrier screening.  Hemoglobinopathy testing was performed previously at [facility] and was normal.  An ultrasound was performed at the time of the visit.  The gestational age was consistent with 12 weeks.  Fetal anatomy could not be assessed due to early gestational age.  Please refer to the ultrasound report for details of that study.  Ms. Destiny Franklin was encouraged to call with questions or concerns.  We can be contacted at [phone].  Labs ordered: first trimester screening  Cherly Andersoneborah F. [place], MS, CGC

## 2018-01-21 ENCOUNTER — Telehealth: Payer: Self-pay | Admitting: Obstetrics and Gynecology

## 2018-01-21 NOTE — Telephone Encounter (Signed)
  Ms. Destiny Franklin elected to undergo First Trimester screening on 01/18/2018.  To review, first trimester screening, includes nuchal translucency ultrasound screen and/or first trimester maternal serum marker screening.  The nuchal translucency has approximately an 80% detection rate for Down syndrome and can be positive for other chromosome abnormalities as well as heart defects.  When combined with a maternal serum marker screening, the detection rate is up to 90% for Down syndrome and up to 97% for trisomy 13 and 18.     The results of the First Trimester Nuchal Translucency and Biochemical Screening were within normal range.  The risk for Down syndrome is now estimated to be less than 1 in 10,000.  The risk for Trisomy 13/18 is also estimated to be less than 1 in 10,000.  Should more definitive information be desired, we would offer amniocentesis.  Because we do not yet know the effectiveness of combined first and second trimester screening, we do not recommend a maternal serum screen to assess the chance for chromosome conditions.  However, if screening for neural tube defects is desired, maternal serum screening for AFP only can be performed between 15 and [redacted] weeks gestation.      Destiny Andersoneborah F. Nnamdi Dacus, MS, CGC

## 2018-01-28 ENCOUNTER — Ambulatory Visit
Admission: RE | Admit: 2018-01-28 | Discharge: 2018-01-28 | Disposition: A | Discharge status: Home / Self Care | Payer: Self-pay | Source: Ambulatory Visit | Attending: Obstetrics and Gynecology | Admitting: Obstetrics and Gynecology

## 2018-01-28 VITALS — BP 107/79 | HR 81 | Temp 98.2°F | Resp 18 | Wt 148.6 lb

## 2018-01-28 DIAGNOSIS — O26892 Other specified pregnancy related conditions, second trimester: Secondary | ICD-10-CM | POA: Insufficient documentation

## 2018-01-28 DIAGNOSIS — O99612 Diseases of the digestive system complicating pregnancy, second trimester: Secondary | ICD-10-CM | POA: Insufficient documentation

## 2018-01-28 DIAGNOSIS — R7989 Other specified abnormal findings of blood chemistry: Secondary | ICD-10-CM

## 2018-01-28 DIAGNOSIS — R634 Abnormal weight loss: Secondary | ICD-10-CM

## 2018-01-28 DIAGNOSIS — O210 Mild hyperemesis gravidarum: Secondary | ICD-10-CM

## 2018-01-28 DIAGNOSIS — Z3A14 14 weeks gestation of pregnancy: Secondary | ICD-10-CM | POA: Insufficient documentation

## 2018-01-28 DIAGNOSIS — R102 Pelvic and perineal pain: Secondary | ICD-10-CM | POA: Insufficient documentation

## 2018-01-28 DIAGNOSIS — Z8619 Personal history of other infectious and parasitic diseases: Secondary | ICD-10-CM | POA: Insufficient documentation

## 2018-01-28 LAB — URINALYSIS, ROUTINE W REFLEX MICROSCOPIC
Bilirubin Urine: NEGATIVE
Glucose, UA: NEGATIVE mg/dL
Hgb urine dipstick: NEGATIVE
Ketones, ur: NEGATIVE mg/dL
Leukocytes, UA: NEGATIVE
NITRITE: NEGATIVE
Protein, ur: NEGATIVE mg/dL
Specific Gravity, Urine: 1.019 (ref 1.005–1.030)
pH: 7 (ref 5.0–8.0)

## 2018-01-28 LAB — COMPREHENSIVE METABOLIC PANEL
ALK PHOS: 36 U/L — AB (ref 38–126)
ALT: 13 U/L (ref 0–44)
AST: 18 U/L (ref 15–41)
Albumin: 4.4 g/dL (ref 3.5–5.0)
Anion gap: 7 (ref 5–15)
BUN: 8 mg/dL (ref 6–20)
CO2: 26 mmol/L (ref 22–32)
Calcium: 9.2 mg/dL (ref 8.9–10.3)
Chloride: 105 mmol/L (ref 98–111)
Creatinine, Ser: 0.4 mg/dL — ABNORMAL LOW (ref 0.44–1.00)
Glucose, Bld: 102 mg/dL — ABNORMAL HIGH (ref 70–99)
Potassium: 3.8 mmol/L (ref 3.5–5.1)
Sodium: 138 mmol/L (ref 135–145)
Total Bilirubin: 0.6 mg/dL (ref 0.3–1.2)
Total Protein: 7.8 g/dL (ref 6.5–8.1)

## 2018-01-28 MED ORDER — DOCUSATE SODIUM 100 MG PO CAPS: 100.0000 mg | ORAL_CAPSULE | Freq: Two times a day (BID) | ORAL | 2 refills | Status: AC

## 2018-01-28 MED ORDER — ONDANSETRON HCL 4 MG PO TABS: 4.0000 mg | ORAL_TABLET | Freq: Every day | ORAL | 1 refills | Status: AC | PRN

## 2018-01-28 MED ORDER — FOLIC ACID 1 MG PO TABS: 1.0000 mg | ORAL_TABLET | Freq: Every day | ORAL | 3 refills | Status: AC

## 2018-01-28 MED ORDER — POLYETHYLENE GLYCOL 3350 17 GM/SCOOP PO POWD: 1.0000 | Freq: Once | ORAL | 0 refills | Status: AC

## 2018-01-28 MED ORDER — BISACODYL 5 MG PO TBEC: 5.0000 mg | DELAYED_RELEASE_TABLET | Freq: Every day | ORAL | 1 refills | Status: AC | PRN

## 2018-01-28 NOTE — Progress Notes (Signed)
[facility] Consultation   Chief Complaint: My thyroid test was low   HPI: Destiny Franklin is a 27 y.o. Married [place] female who speaks Portugese and a little [place] . A interpreter by iPad was used. She is from [place] where her family still lives She is a G1P0 at [redacted]w[redacted]d by LMP 4/17 and scan at [facility]  who presents in consultation from [facility]   for low TSH x 2 .  Her Free t4 is normal , pt has no h/o thyroid disease or family history. She has experienced weight loss of 20 lbs since becoming pregnant .  Her biggest complaint today is her persistent N&V , she vomits multiple times per day . She is able to drink water. She was seen in the ER on 01/04/18 for abd pain weight 150 lbs)  and underwent an abdominal MRI which was negative. She was also seen in March for pelvic pain weight was 160lbs . 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 Reglan is listed as an allergy. We discussed her diagnosis of H Pylori in [place] she had a different kind of pain then and was treated and got better. She notes her stomach has never really been the same - she can't tolerate certain foods or eat the way she could before . Constipation is also a problem.  She feels like her nausea is getting a little better. She underwent genetic counseling for a family h/o mm disorder that was ALS and a first tri screen that was negative.   Past Medical History: Patient  has a past medical history of H. pylori infection.  Past Surgical History: She  has no past surgical history on file.  Obstetric History:  OB History    Gravida  1   Para      Term      Preterm      AB      Living  0     SAB      TAB      Ectopic      Multiple      Live Births             Gynecologic History:  Patient's last menstrual period was 10/16/2017 (exact date).    Medications: PNV she has  been trying to take them in the afternoon  Allergies: Patient is allergic to metoclopramide.  Social History: Patient  reports that she has never smoked. She has never used smokeless tobacco. She reports that she does not drink alcohol or use drugs.  Family History: ALS see genetic counseling note ` Review of Systems daily nausea , constipation  Physical Exam: BP 107/79 (BP Location: Right Arm)   Pulse 81   Temp 98.2 F (36.8 C) (Oral)   Resp 18   Wt 148 lb 9.6 oz (67.4 kg)   LMP 10/16/2017 (Exact Date)   SpO2 100%   BMI 22.59 kg/m  Pale talkative female no acute distress, no vomiting or spitting during interview No exophthalmos  Steady pulse in 60s on repeat Thyroid not enlarged symmetric FHR 150s No tremor   Asessement: 1. Hyperemesis affecting pregnancy, antepartum   2. Loss of weight   3. Low TSH level    Pt has a low TSH x2 with a normal T4 - in early pregnancy we can see low TSH related  to the rapid increase of HCG and its TSH -like structure and function. I agree that a repeatedly low TSH in a pt losing weight is concerning.  Review of there first trimester screen shows a normal HCG level and u/s shows a SLIUP - helping to r/o abnormally elevated HCG with  mole /partial mole or twinning as a cause of her depressed TSH. Pt reports a concerning weight  loss of 20 pounds , review of her chart shows a 7 lb loss since an ER visit in Early July  By physical she appears euthyroid with normal pulse, no exophthalmos , no tremor, no temperature intolerance.  I think most likely explanation of her repeatedly low TSH is related to her starvation/ hyperemesis and not due to autoimmune hyperthyroidism. Hyperthyroidism can cause hyperemesis as well as the other way around but with a normal Free T4 this seems less likely.  Transient thyroid testing can be noted with hyperemesis  in the early part of pregnancy and frequently normalize without therapy Plan: 1-Treat hyperemesis - I offered  zofran with  treatment for constipation- pt should be out of teratogenic period and cannot tolerate drowsiness as a side effect [facility], I offered folic acid in place of PNV until she can tolerate po intake better Pt has a h/o h pylori and persistent GI issues, She  may benefit from zantac or other acid treatments. We discussed bland foods, ginger and avoiding food smells  CMP  UA sent  Advised to go to ER if unable to take water  RTC 2 weeks to confirm gaining weight and review labs  2- REpeat TFTs including a Free T3 to r/o T3 thyrotoxicosis - pt denies any supplements/drugs herbal remedies form [place]  If free  T4 or free T3 is up consider Rx of methimazole and evaluation of TRAb, Thyroid peroxidase antibodies   3- FOB recent travel to [place] - [facility] warned them to use condoms related ot potential Zika exposure  Fetal assessment : Anatomy scan ordered end of August Consider third trimester scan for growth   Pt had 2 visits to ER in the past year before this pregnancy for nonspecific issues - DV screen negative at [facility] no indications today  Total time spent with the patient was 30 minutes with greater than 50% spent in counseling and coordination of care. We appreciate this interesting consult and will be happy to be involved in the ongoing care of Ms. Destiny Franklin in anyway her obstetricians desire. Jimmey RalphLivingston, Destiny Franklin   [facility]

## 2018-01-29 LAB — THYROID PANEL
Free Thyroxine Index: 1.7 (ref 1.2–4.9)
T3 Uptake Ratio: 17 % — ABNORMAL LOW (ref 24–39)
T4, Total: 9.8 ug/dL (ref 4.5–12.0)

## 2018-01-29 LAB — T3, FREE: T3 FREE: 3.7 pg/mL (ref 2.0–4.4)

## 2018-02-06 ENCOUNTER — Other Ambulatory Visit: Payer: Self-pay

## 2018-02-06 ENCOUNTER — Emergency Department
Admission: EM | Admit: 2018-02-06 | Discharge: 2018-02-06 | Disposition: A | Discharge status: Home / Self Care | Payer: Self-pay | Attending: Emergency Medicine | Admitting: Emergency Medicine

## 2018-02-06 DIAGNOSIS — Z3A15 15 weeks gestation of pregnancy: Secondary | ICD-10-CM | POA: Insufficient documentation

## 2018-02-06 DIAGNOSIS — O26892 Other specified pregnancy related conditions, second trimester: Secondary | ICD-10-CM | POA: Insufficient documentation

## 2018-02-06 DIAGNOSIS — R300 Dysuria: Secondary | ICD-10-CM | POA: Insufficient documentation

## 2018-02-06 LAB — URINALYSIS, ROUTINE W REFLEX MICROSCOPIC
Bilirubin Urine: NEGATIVE
Glucose, UA: NEGATIVE mg/dL
Hgb urine dipstick: NEGATIVE
Ketones, ur: NEGATIVE mg/dL
LEUKOCYTES UA: NEGATIVE
NITRITE: NEGATIVE
PROTEIN: NEGATIVE mg/dL
Specific Gravity, Urine: 1.018 (ref 1.005–1.030)
pH: 7 (ref 5.0–8.0)

## 2018-02-06 NOTE — ED Notes (Signed)
[facility]

## 2018-02-06 NOTE — ED Triage Notes (Addendum)
Patient reports dysuria for several days.  Patient reports being [redacted] weeks pregnant.

## 2018-02-06 NOTE — ED Provider Notes (Signed)
[facility] Emergency Department Provider Note  ____________________________________________  Time seen: Approximately 11:31 PM  I have reviewed the triage vital signs and the nursing notes.   HISTORY  Chief Complaint Dysuria    HPI Destiny Franklin is a 27 y.o. female who presents to the emergency department for treatment and evaluation of dysuria.  She is [redacted] weeks pregnant and states that over the past couple of days that she has noticed a sharp shooting pain in her left flank area when she urinates as well as pain in the bladder.  She also states that she has had to urinate more frequently.  She denies vaginal bleeding or discharge.  Her last visit with her gynecologist was approximately 3 weeks ago.  She has had a vaginal exam with swabs during her intake visit and did not require treatment for any type of infections.  She denies change in sexual partners or concerns for STDs.   Past Medical History:  Diagnosis Date  . H. pylori infection     Patient Active Problem List   Diagnosis Date Noted  . Loss of weight 01/28/2018  . Hyperemesis affecting pregnancy, antepartum 01/28/2018  . Low TSH level 01/28/2018  . Family history of disorder of muscle   . First trimester screening     No past surgical history on file.  Prior to Admission medications   Medication Sig Start Date End Date Taking? Authorizing Provider  bisacodyl (DULCOLAX) 5 MG EC tablet Take 1 tablet (5 mg total) by mouth daily as needed for moderate constipation. 01/28/18 01/28/19  Jimmey Ralph, MD  docusate sodium (COLACE) 100 MG capsule Take 1 capsule (100 mg total) by mouth 2 (two) times daily. 01/28/18 01/28/19  Jimmey Ralph, MD  folic acid (FOLVITE) 1 MG tablet Take 1 tablet (1 mg total) by mouth daily. Use in place of vitamins while nauseated 01/28/18 01/28/19  Jimmey Ralph, MD  meclizine (ANTIVERT) 25 MG tablet Take 1 tablet (25 mg total) by mouth 3 (three)  times daily as needed for dizziness. Patient not taking: Reported on 01/18/2018 09/30/16   Phineas Semen, MD  ondansetron [facility]) 4 MG tablet Take 1 tablet (4 mg total) by mouth daily as needed for nausea or vomiting. 01/28/18 01/28/19  Jimmey Ralph, MD  Prenatal Vit-Fe Fumarate-FA (PRENATAL MULTIVITAMIN) TABS tablet Take 1 tablet by mouth daily at 12 noon.    [provider]    Allergies Metoclopramide  No family history on file.  Social History Social History   Tobacco Use  . Smoking status: Never Smoker  . Smokeless tobacco: Never Used  Substance Use Topics  . Alcohol use: No  . Drug use: No    Review of Systems Constitutional: Negative for fever. Respiratory: Negative for shortness of breath or cough. Gastrointestinal: Negative for abdominal pain; negative for nausea , negative for vomiting. Genitourinary: Positive for dysuria , negative for vaginal discharge. Musculoskeletal: Positive for back pain. Skin: Negative for acute skin changes/rash/lesion. ____________________________________________   PHYSICAL EXAM:  VITAL SIGNS: ED Triage Vitals  Enc Vitals Group     BP 02/06/18 2000 (!) 109/56     Pulse Rate 02/06/18 2000 65     Resp 02/06/18 2000 17     Temp 02/06/18 2000 98.5 F (36.9 C)     Temp Source 02/06/18 2000 Oral     SpO2 02/06/18 2000 100 %     Weight --      Height --      Head Circumference --  Peak Flow --      Pain Score 02/06/18 2013 6     Pain Loc --      Pain Edu? --      Excl. in GC? --     Constitutional: Alert and oriented. Well appearing and in no acute distress. Eyes: Conjunctivae are normal. Head: Atraumatic. Nose: No congestion/rhinnorhea. Mouth/Throat: Mucous membranes are moist. Respiratory: Normal respiratory effort.  No retractions. Gastrointestinal: Bowel sounds active x 4; Abdomen is soft without rebound or guarding. Genitourinary: Pelvic exam: Deferred Musculoskeletal: No extremity tenderness nor  edema.  Neurologic:  Normal speech and language. No gross focal neurologic deficits are appreciated. Speech is normal. No gait instability. Skin:  Skin is warm, dry and intact. No rash noted on exposed skin. Psychiatric: Mood and affect are normal. Speech and behavior are normal.  ____________________________________________   LABS (all labs ordered are listed, but only abnormal results are displayed)  Labs Reviewed  URINALYSIS, ROUTINE W REFLEX MICROSCOPIC - Abnormal; Notable for the following components:      Result Value   Color, Urine YELLOW (*)    APPearance CLEAR (*)    All other components within normal limits  URINE CULTURE   ____________________________________________  RADIOLOGY  Not indicated ____________________________________________  Procedures  ____________________________________________  27 year old female presenting to the emergency department for treatment and evaluation of dysuria.  Urinalysis does not show any sign of infection however, it has been sent for culture.  Symptoms most likely pregnancy related change.  Patient was advised that she should take Tylenol and call her gynecologist if symptoms do not improve over the next couple of days.  Because she has had appropriate care during pregnancy, I do not see any point in performing a pelvic exam here as she denies any vaginal discharge, odor, bleeding, or drainage and is not concerned for STD.  Patient agreed with this plan and will call her gynecologist on Monday if symptoms continue through the weekend.  INITIAL IMPRESSION / ASSESSMENT AND PLAN / ED COURSE  Pertinent labs & imaging results that were available during my care of the patient were reviewed by me and considered in my medical decision making (see chart for details).  ____________________________________________   FINAL CLINICAL IMPRESSION(S) / ED DIAGNOSES  Final diagnoses:  Dysuria during pregnancy, second trimester    Note:  This  document was prepared using Dragon voice recognition software and may include unintentional dictation errors.    Chinita Pesterriplett, Takaya Hyslop B, FNP 02/06/18 2336    Myrna BlazerSchaevitz, David Matthew, MD 02/07/18 [phone]

## 2018-02-08 ENCOUNTER — Ambulatory Visit: Payer: Self-pay

## 2018-02-08 LAB — URINE CULTURE

## 2018-02-11 ENCOUNTER — Ambulatory Visit
Admission: RE | Admit: 2018-02-11 | Discharge: 2018-02-11 | Disposition: A | Discharge status: Home / Self Care | Payer: MEDICAID | Source: Ambulatory Visit | Attending: Maternal & Fetal Medicine | Admitting: Maternal & Fetal Medicine

## 2018-02-11 VITALS — BP 110/55 | HR 85 | Temp 98.2°F | Resp 18 | Wt 152.4 lb

## 2018-02-11 DIAGNOSIS — R7989 Other specified abnormal findings of blood chemistry: Secondary | ICD-10-CM

## 2018-02-11 DIAGNOSIS — Z8269 Family history of other diseases of the musculoskeletal system and connective tissue: Secondary | ICD-10-CM

## 2018-02-11 DIAGNOSIS — R634 Abnormal weight loss: Secondary | ICD-10-CM

## 2018-02-11 DIAGNOSIS — O210 Mild hyperemesis gravidarum: Secondary | ICD-10-CM

## 2018-02-11 NOTE — Progress Notes (Signed)
[facility] Follow up Consultation  27 yo G1 at 16/1 weeks here for follow up for severe hyperemesis and low TSH.  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  She was noted to have low TSH but normal T4/T3 levels.  Most recent labs (7/35) demonstrate the same profile and she now report RESOLUTION of her N/V.  She is eating [place] [employer]ai meals every 3 hours and not taking any medication for hyperemesis.  She has gained ~ 2 kilos since her visit here on 01/28/18.  Her family history is significant for ALS and she met with our genetic counselor during the current pregnancy.   Current Outpatient Medications on File Prior to Encounter  Medication Sig Dispense Refill  . Prenatal Vit-Fe Fumarate-FA (PRENATAL MULTIVITAMIN) TABS tablet Take 1 tablet by mouth daily at 12 noon.    . bisacodyl (DULCOLAX) 5 MG EC tablet Take 1 tablet (5 mg total) by mouth daily as needed for moderate constipation. (Patient not taking: Reported on 02/11/2018) 30 tablet 1  . docusate sodium (COLACE) 100 MG capsule Take 1 capsule (100 mg total) by mouth 2 (two) times daily. (Patient not taking: Reported on 02/11/2018) 60 capsule 2  . folic acid (FOLVITE) 1 MG tablet Take 1 tablet (1 mg total) by mouth daily. Use in place of vitamins while nauseated (Patient not taking: Reported on 02/11/2018) 30 tablet 3  . meclizine (ANTIVERT) 25 MG tablet Take 1 tablet (25 mg total) by mouth 3 (three) times daily as needed for dizziness. (Patient not taking: Reported on 01/18/2018) 30 tablet 0  . ondansetron (ZOFRAN) 4 MG tablet Take 1 tablet (4 mg total) by mouth daily as needed for nausea or vomiting. (Patient not taking: Reported on 02/11/2018) 30 tablet 1   No current facility-administered medications on file prior to encounter.      Vitals:   02/11/18 0919  BP: (!) 110/55  Pulse: 85  Resp: 18  Temp: 98.2 F (36.8 C)   SpO2: 97%   FHR 160s (bedside US)   1. Hyperemesis with low TSH, normal free T4--resolved. Advised follow up fetal growth at ~28 weeks and ~32-34 weeks.  Euthyuroid.  No meds.  It is reasonable to repeat Thyroid function tests with third trimester labs/glucose screen.   2. Zika exposure? She has not travelled to [place] during pregnancy, however her husband is in [place] currently.  She is aware of recommendation to use condoms due to expected duration of virus in semen of ~59month.  Recommend follow up growth 28 weeks and ~32-34 weeks.

## 2018-02-25 ENCOUNTER — Other Ambulatory Visit: Payer: Self-pay | Admitting: Obstetrics and Gynecology

## 2018-02-25 DIAGNOSIS — Z3689 Encounter for other specified antenatal screening: Secondary | ICD-10-CM

## 2018-03-01 ENCOUNTER — Ambulatory Visit
Admission: RE | Admit: 2018-03-01 | Discharge: 2018-03-01 | Disposition: A | Discharge status: Home / Self Care | Payer: Self-pay | Source: Ambulatory Visit | Attending: Obstetrics and Gynecology | Admitting: Obstetrics and Gynecology

## 2018-03-01 DIAGNOSIS — Z3689 Encounter for other specified antenatal screening: Secondary | ICD-10-CM | POA: Insufficient documentation

## 2018-03-01 DIAGNOSIS — R7989 Other specified abnormal findings of blood chemistry: Secondary | ICD-10-CM | POA: Insufficient documentation

## 2018-03-01 DIAGNOSIS — Z3A18 18 weeks gestation of pregnancy: Secondary | ICD-10-CM | POA: Insufficient documentation

## 2018-03-01 DIAGNOSIS — O210 Mild hyperemesis gravidarum: Secondary | ICD-10-CM | POA: Insufficient documentation

## 2018-03-01 DIAGNOSIS — R634 Abnormal weight loss: Secondary | ICD-10-CM | POA: Insufficient documentation

## 2018-03-04 ENCOUNTER — Other Ambulatory Visit: Payer: Self-pay

## 2018-03-11 ENCOUNTER — Other Ambulatory Visit: Payer: Self-pay | Admitting: Obstetrics and Gynecology

## 2018-03-11 ENCOUNTER — Other Ambulatory Visit: Payer: Self-pay | Admitting: Maternal & Fetal Medicine

## 2018-03-11 DIAGNOSIS — Z3689 Encounter for other specified antenatal screening: Secondary | ICD-10-CM

## 2018-03-11 DIAGNOSIS — Z0489 Encounter for examination and observation for other specified reasons: Secondary | ICD-10-CM

## 2018-03-11 DIAGNOSIS — IMO0002 Reserved for concepts with insufficient information to code with codable children: Secondary | ICD-10-CM

## 2018-03-15 ENCOUNTER — Ambulatory Visit
Admission: RE | Admit: 2018-03-15 | Discharge: 2018-03-15 | Disposition: A | Discharge status: Home / Self Care | Payer: Self-pay | Source: Ambulatory Visit | Attending: Obstetrics and Gynecology | Admitting: Obstetrics and Gynecology

## 2018-03-15 DIAGNOSIS — Z3689 Encounter for other specified antenatal screening: Secondary | ICD-10-CM | POA: Insufficient documentation

## 2018-03-15 DIAGNOSIS — R634 Abnormal weight loss: Secondary | ICD-10-CM | POA: Insufficient documentation

## 2018-03-15 DIAGNOSIS — R7989 Other specified abnormal findings of blood chemistry: Secondary | ICD-10-CM | POA: Insufficient documentation

## 2018-03-15 DIAGNOSIS — O210 Mild hyperemesis gravidarum: Secondary | ICD-10-CM | POA: Insufficient documentation

## 2018-03-15 DIAGNOSIS — Z3A20 20 weeks gestation of pregnancy: Secondary | ICD-10-CM | POA: Insufficient documentation

## 2018-04-08 ENCOUNTER — Inpatient Hospital Stay: Admission: RE | Admit: 2018-04-08 | Payer: Self-pay | Source: Ambulatory Visit

## 2018-04-12 ENCOUNTER — Inpatient Hospital Stay: Admission: RE | Admit: 2018-04-12 | Payer: Self-pay | Source: Ambulatory Visit

## 2018-05-10 ENCOUNTER — Other Ambulatory Visit: Payer: Self-pay

## 2018-10-20 ENCOUNTER — Encounter (HOSPITAL_COMMUNITY): Payer: Self-pay

## 2019-08-11 IMAGING — MR MR ABDOMEN W/O CM
6 of 8 series · 24 of 48 positions shown · non-contrast
Comparison: Pelvic ultrasound dated 01/04/2018

CLINICAL DATA: 26-year-old pregnant female presenting with
abdominal pain. Concern for acute appendicitis.

EXAM:
MRI ABDOMEN AND PELVIS WITHOUT CONTRAST
TECHNIQUE: Multiplanar multisequence MR imaging of the abdomen and pelvis was
performed. No intravenous contrast was administered.

[Series 4: T2 fat-sat · axial · 5.0mm · 0.74mm/px · z∈[-280,+134]mm · 7 of 70 slices shown (1 of 2)]
[im 1/70]
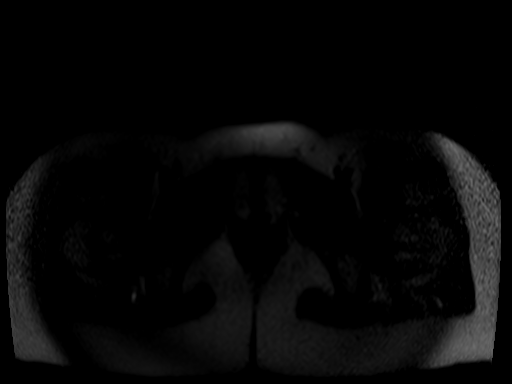
[im 12/70]
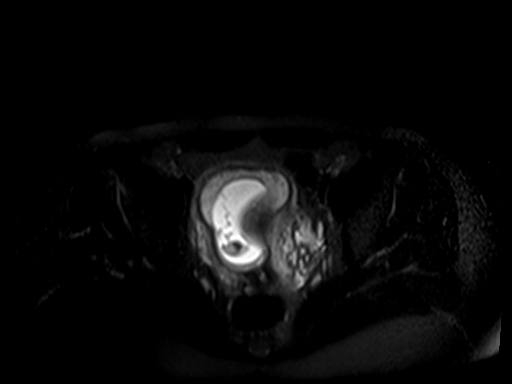
[im 24/70]
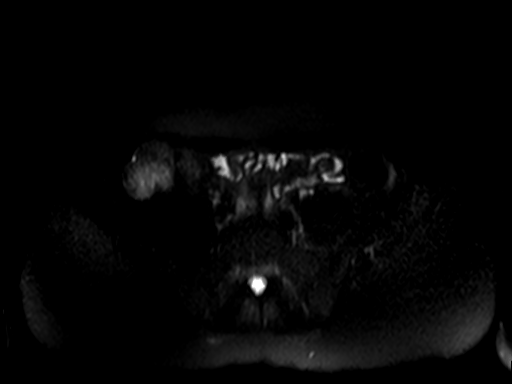
[im 35/70]
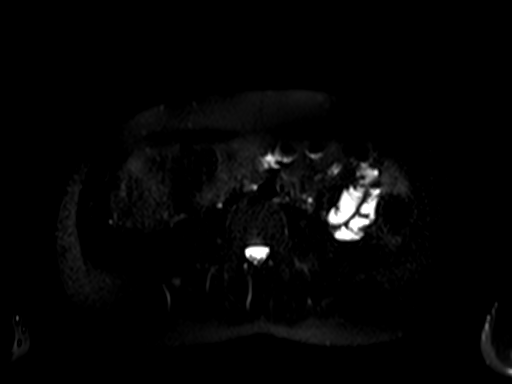
[im 47/70]
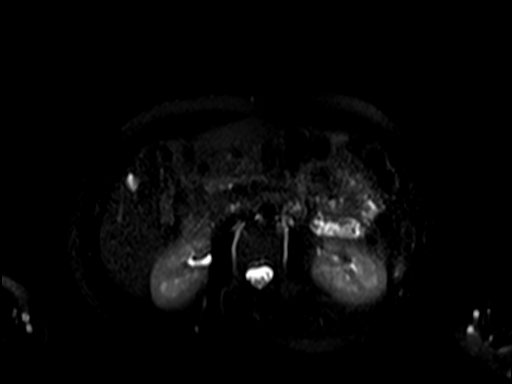
[im 58/70]
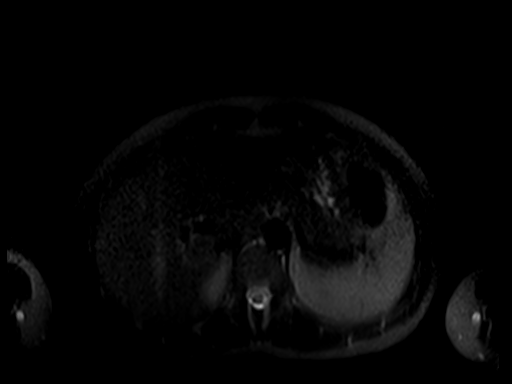
[im 70/70]
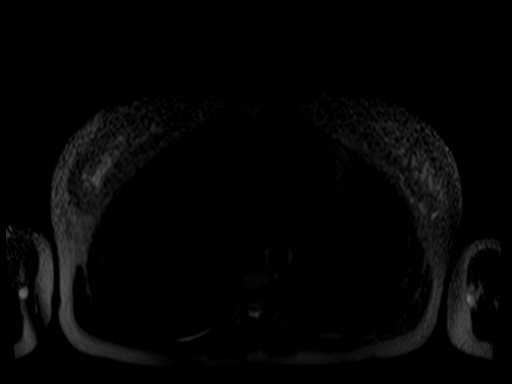

[Series 5: T2 · axial · 5.0mm · 0.74mm/px · z∈[-280,+134]mm · 6 of 70 slices shown (1 of 2)]
[im 1/70]
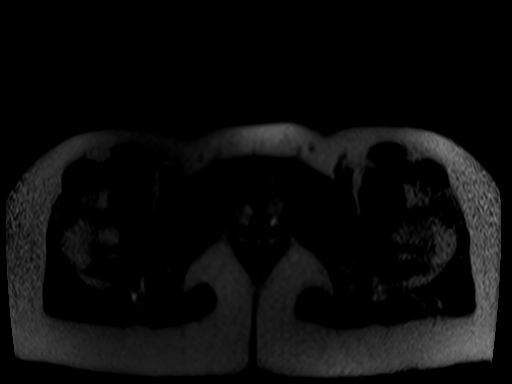
[im 14/70]
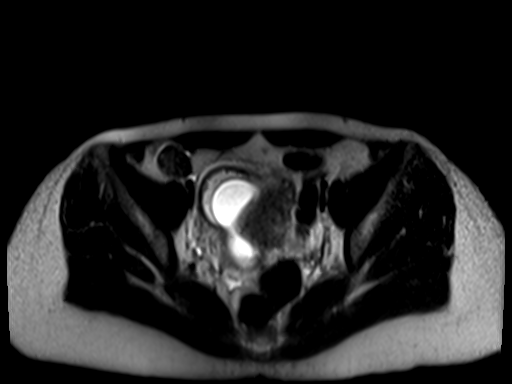
[im 28/70]
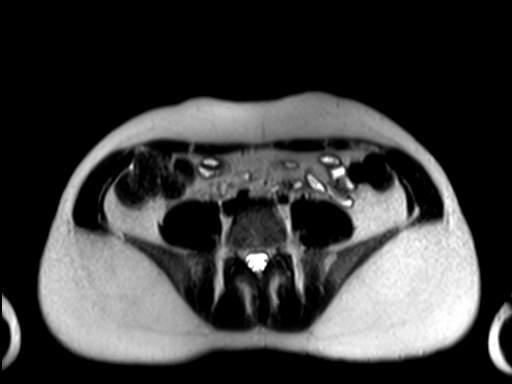
[im 42/70]
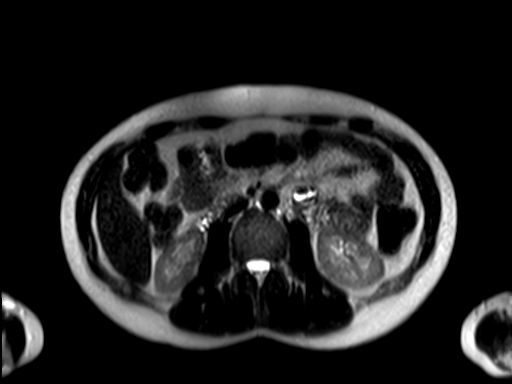
[im 56/70]
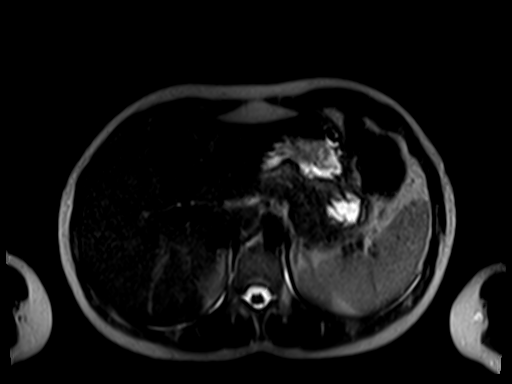
[im 70/70]
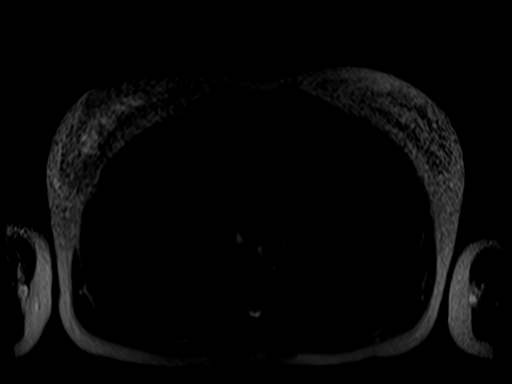

[Series 6: T2 · coronal · 6.0mm · 0.82mm/px · 2 of 28 slices shown (2 of 2)]
[im 1/28]
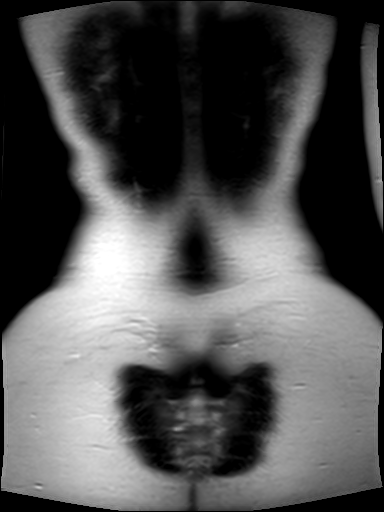
[im 28/28]
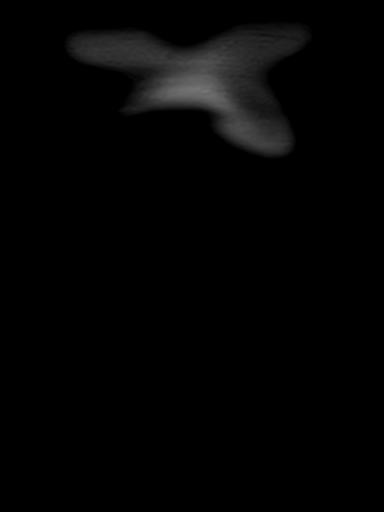

[Series 7: T2 fat-sat · coronal · 6.0mm · 0.82mm/px · 2 of 28 slices shown (2 of 2)]
[im 1/28]
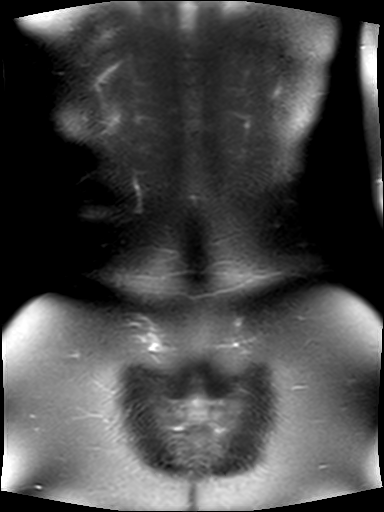
[im 28/28]
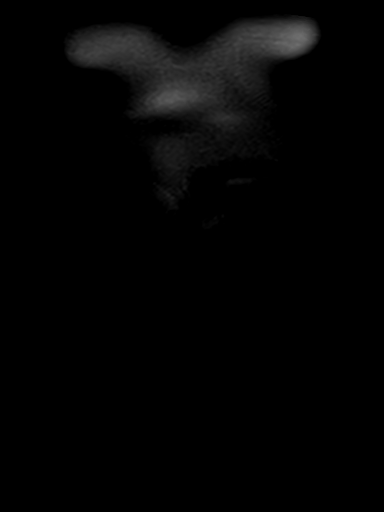

[Series 8: bSSFP · axial · 5.0mm · 0.62mm/px · z∈[-271,+113]mm · 6 of 65 slices shown (1 of 2)]
[im 1/65]
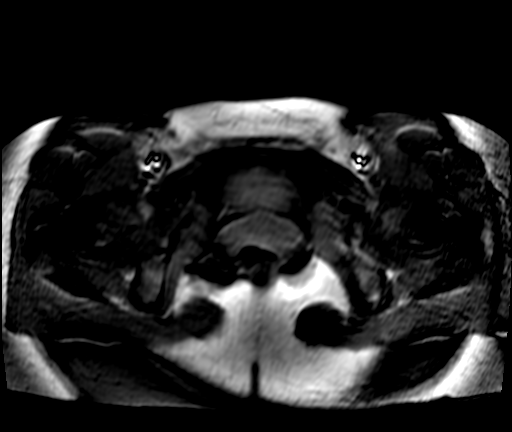
[im 13/65]
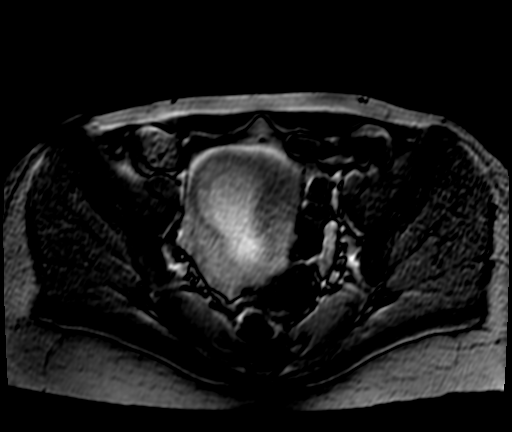
[im 26/65]
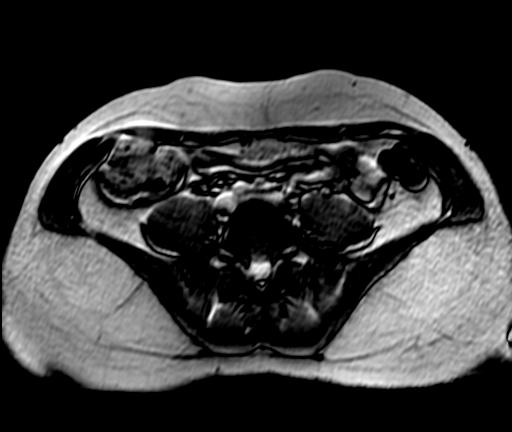
[im 39/65]
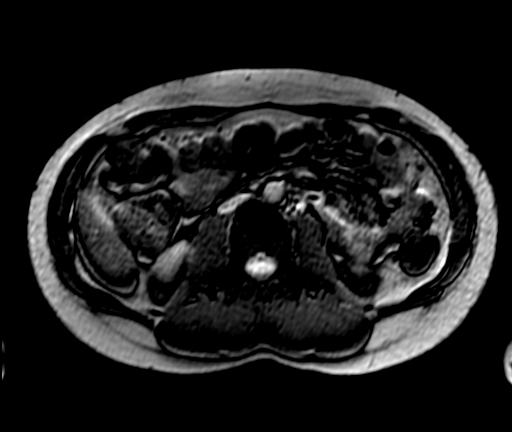
[im 52/65]
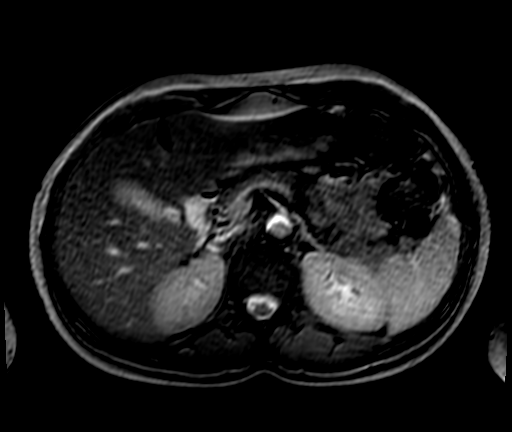
[im 65/65]
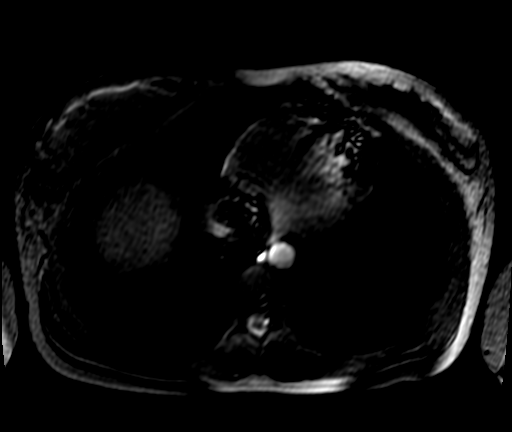

[Series 9: bSSFP · coronal · 5.0mm · 0.82mm/px · 1 of 37 slices shown (2 of 2)]
[im 1/37]
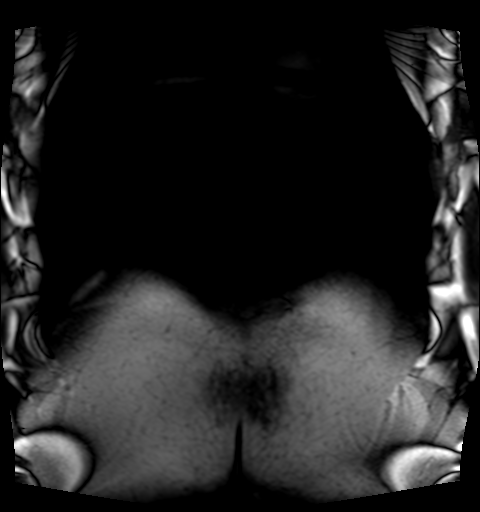

[24 of 48 positions shown; findings below may reference images not displayed]

FINDINGS: COMBINED FINDINGS FOR BOTH MR ABDOMEN AND PELVIS

Evaluation of this exam is limited due to respiratory motion
artifact.

Lower chest: The visualized lung bases are clear.

Hepatobiliary: No mass or other parenchymal abnormality identified.

Pancreas: No mass, inflammatory changes, or other parenchymal
abnormality identified.

Spleen:  Within normal limits in size and appearance.

Adrenals/Urinary Tract: The adrenal glands are unremarkable. There
is minimal fullness of the right renal collecting system. The left
kidney is unremarkable. The visualized ureters and urinary bladder
appear unremarkable.

Stomach/Bowel: There is no bowel obstruction or active inflammation.
The visualized appendix is unremarkable (series 11 images [id number]).

Vascular/Lymphatic: No pathologically enlarged lymph nodes
identified. No abdominal aortic aneurysm demonstrated.

Reproductive: An intrauterine pregnancy is identified but not
evaluated on this MRI. The uterus and ovaries are unremarkable.

Other:  None

Musculoskeletal: No suspicious bone lesions identified.
IMPRESSION: 1. No acute intra-abdominal or pelvic pathology. No bowel
obstruction for active inflammation. Normal appendix.
2. Minimal fullness of the right renal collecting system.
3. Intrauterine pregnancy.
